# Patient Record
Sex: Male | Born: 1990 | Race: Black or African American | Hispanic: No | Marital: Single | Smoking: Current every day smoker
Health system: Southern US, Community
[De-identification: ages and names within clinical notes are randomized; demographics above are authoritative.]

---
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
## 2011-06-17 ENCOUNTER — Encounter: Payer: Self-pay | Admitting: *Deleted

## 2011-06-17 ENCOUNTER — Emergency Department (HOSPITAL_COMMUNITY)
Admission: EM | Admit: 2011-06-17 | Discharge: 2011-06-17 | Disposition: A | Discharge status: Home / Self Care | Payer: Self-pay | Attending: Emergency Medicine | Admitting: Emergency Medicine

## 2011-06-17 DIAGNOSIS — M79609 Pain in unspecified limb: Secondary | ICD-10-CM | POA: Insufficient documentation

## 2011-06-17 DIAGNOSIS — L03319 Cellulitis of trunk, unspecified: Secondary | ICD-10-CM | POA: Insufficient documentation

## 2011-06-17 DIAGNOSIS — L02214 Cutaneous abscess of groin: Secondary | ICD-10-CM

## 2011-06-17 DIAGNOSIS — L02219 Cutaneous abscess of trunk, unspecified: Secondary | ICD-10-CM | POA: Insufficient documentation

## 2011-06-17 MED ORDER — DOXYCYCLINE HYCLATE 100 MG PO CAPS: 100.0000 mg | ORAL_CAPSULE | Freq: Two times a day (BID) | ORAL | Status: AC

## 2011-06-17 MED ORDER — ONDANSETRON 4 MG PO TBDP
8.0000 mg | ORAL_TABLET | Freq: Once | ORAL | Status: AC
  Administered 2011-06-17: 8 mg via ORAL
  Filled 2011-06-17: qty 2

## 2011-06-17 MED ORDER — NAPROXEN 500 MG PO TABS: 500.0000 mg | ORAL_TABLET | Freq: Two times a day (BID) | ORAL | Status: DC

## 2011-06-17 NOTE — ED Notes (Signed)
C/o R upper medial thigh pain with swelling, pain & swelling near groin. (denies: fever,nvd,urinary sx, back pain, radiation, numbness tingling). No h/o same.

## 2011-06-17 NOTE — ED Provider Notes (Signed)
History     CSN: [id number]  Arrival date & time 06/17/11  0139   First MD Initiated Contact with Patient 06/17/11 0215      Chief Complaint  Patient presents with  . Leg Pain    (Consider location/radiation/quality/duration/timing/severity/associated sxs/prior treatment) HPI Comments: 21 year old male with no medical history who presents with gradual onset of pain pain in his right proximal thigh extending up into his right inguinal area. This is constant, gradually worsening over the last 4 days.  There is no associated testicular pain, urethral discharge, dysuria, abdominal pain, diarrhea.  He denies history of STD or injury to the right leg or groin. Symptoms are persistent  Patient is a 21 y.o. male presenting with leg pain. The history is provided by the patient and a friend.  Leg Pain     History reviewed. No pertinent past medical history.  History reviewed. No pertinent past surgical history.  Family History  Problem Relation Age of Onset  . Hypertension Mother   . Hypertension Other   . Cancer Other     History  Substance Use Topics  . Smoking status: Current Everyday Smoker -- 0.5 packs/day  . Smokeless tobacco: Not on file  . Alcohol Use: Yes      Review of Systems  All other systems reviewed and are negative.    Allergies  Review of patient's allergies indicates no known allergies.  Home Medications   Current Outpatient Rx  Name Route Sig Dispense Refill  . DOXYCYCLINE HYCLATE 100 MG PO CAPS Oral Take 1 capsule (100 mg total) by mouth 2 (two) times daily. 20 capsule 0  . NAPROXEN 500 MG PO TABS Oral Take 1 tablet (500 mg total) by mouth 2 (two) times daily with a meal. 30 tablet 0    BP 164/67  Pulse 119  Temp(Src) 98.5 F (36.9 C) (Oral)  Resp 20  SpO2 100%  Physical Exam  Nursing note and vitals reviewed. Constitutional: He appears well-developed and well-nourished. No distress.  HENT:  Head: Normocephalic and atraumatic.    Mouth/Throat: Oropharynx is clear and moist. No oropharyngeal exudate.  Eyes: Conjunctivae and EOM are normal. Pupils are equal, round, and reactive to light. Right eye exhibits no discharge. Left eye exhibits no discharge. No scleral icterus.  Neck: Normal range of motion. Neck supple. No JVD present. No thyromegaly present.  Cardiovascular: Normal rate, regular rhythm, normal heart sounds and intact distal pulses.  Exam reveals no gallop and no friction rub.   No murmur heard. Pulmonary/Chest: Effort normal and breath sounds normal. No respiratory distress. He has no wheezes. He has no rales.  Abdominal: Soft. Bowel sounds are normal. He exhibits no distension and no mass. There is no tenderness.  Genitourinary:       Normal appearing penis and testicles, right inguinal area with 1.5 cm swollen, moderately tender fluctuant mass.  Does not spread onto the perineum or the scrotum.    Musculoskeletal: Normal range of motion. He exhibits no edema and no tenderness.       Mild tenderness to the right proximal medial thigh, no masses or palpable lymph nodes.  Lymphadenopathy:    He has no cervical adenopathy.  Neurological: He is alert. Coordination normal.  Skin: Skin is warm and dry. No rash noted. No erythema.       No erythema to the skin other than the overlying skin over the abscess in R inguinal area.  Psychiatric: He has a normal mood and affect. His behavior is  normal.    ED Course  Procedures (including critical care time)  Labs Reviewed - No data to display No results found.   1. Abscess of right groin       MDM  Bedside ultrasound confirms abscess, incision and drainage indicated, antibiotics due 2 tenderness in the proximal thigh and possible spread of infection. Patient overall appears well, calm, in no acute distress  INCISION AND DRAINAGE Performed by: Eber Hong D Consent: Verbal consent obtained. Risks and benefits: risks, benefits and alternatives were  discussed Type: abscess  Body area: Right inguinal area  Anesthesia: local infiltration  Local anesthetic: lidocaine 1% % with epinephrine  Anesthetic total: 1 ml  Complexity: complex Blunt dissection to break up loculations  Drainage: purulent  Drainage amount: Small   Packing material: 1/4 in iodoform gauze  Patient tolerance: Patient tolerated the procedure well with no immediate complications.  Discharge prescriptions,   Doxycycline, Naprosyn            Vida Roller, MD 06/17/11 [phone]

## 2011-06-30 IMAGING — CR DG WRIST COMPLETE 3+V*R*
3 series · 3 of 3 positions shown · non-contrast
Comparison: None.

CLINICAL DATA: Injury playing football.

RIGHT WRIST - COMPLETE 3+ VIEW

[view not recorded (1 of 3)]
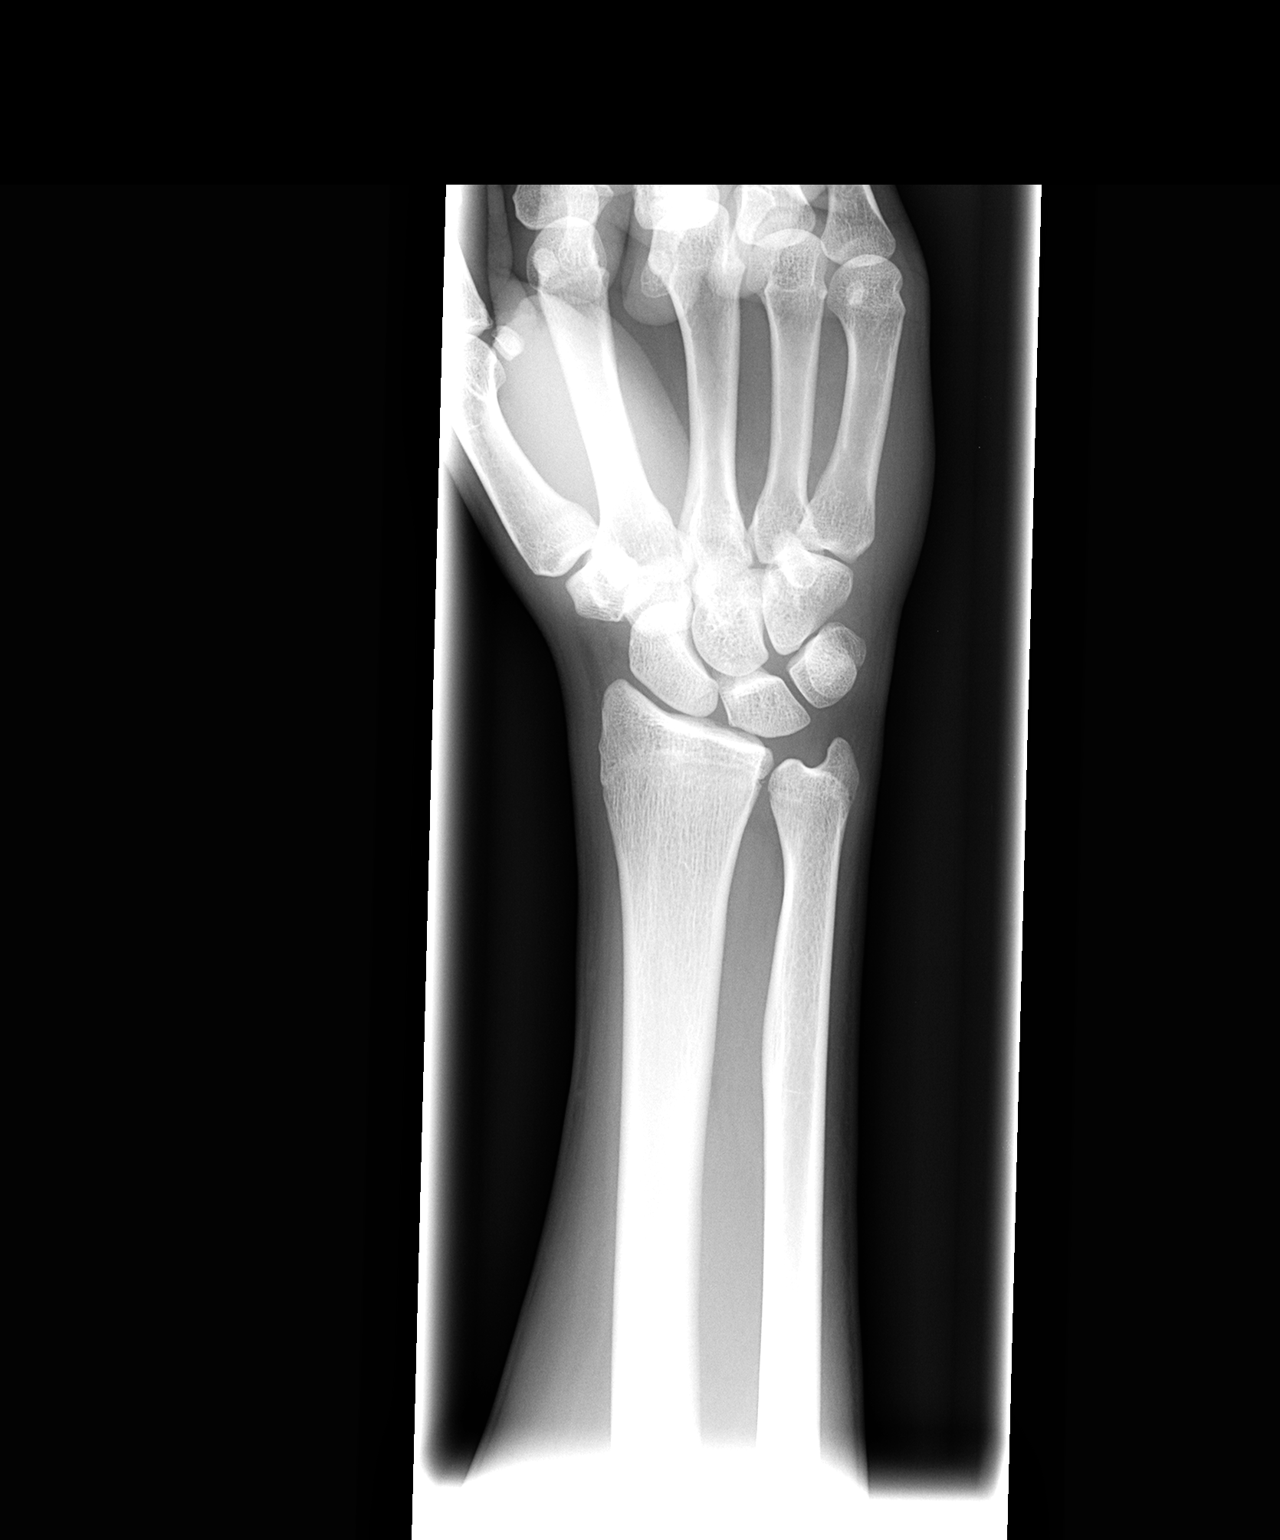

[view not recorded (2 of 3)]
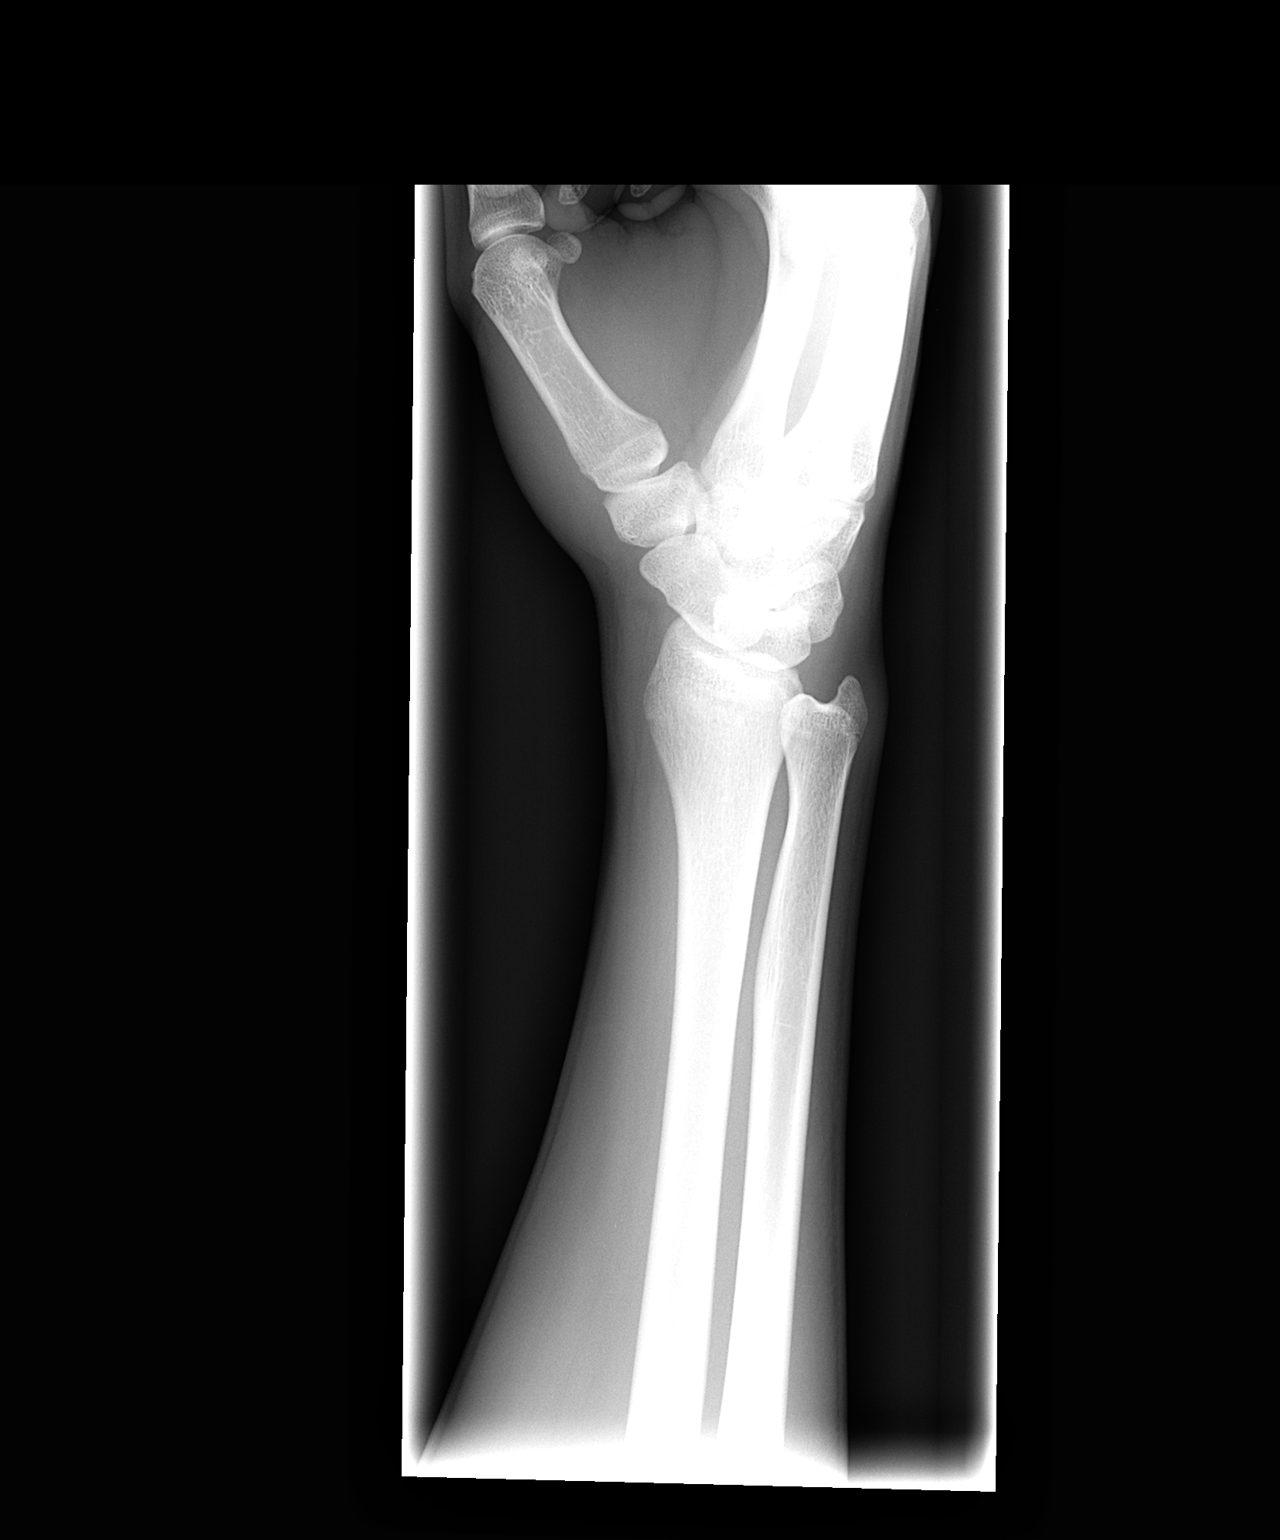

[view not recorded (3 of 3)]
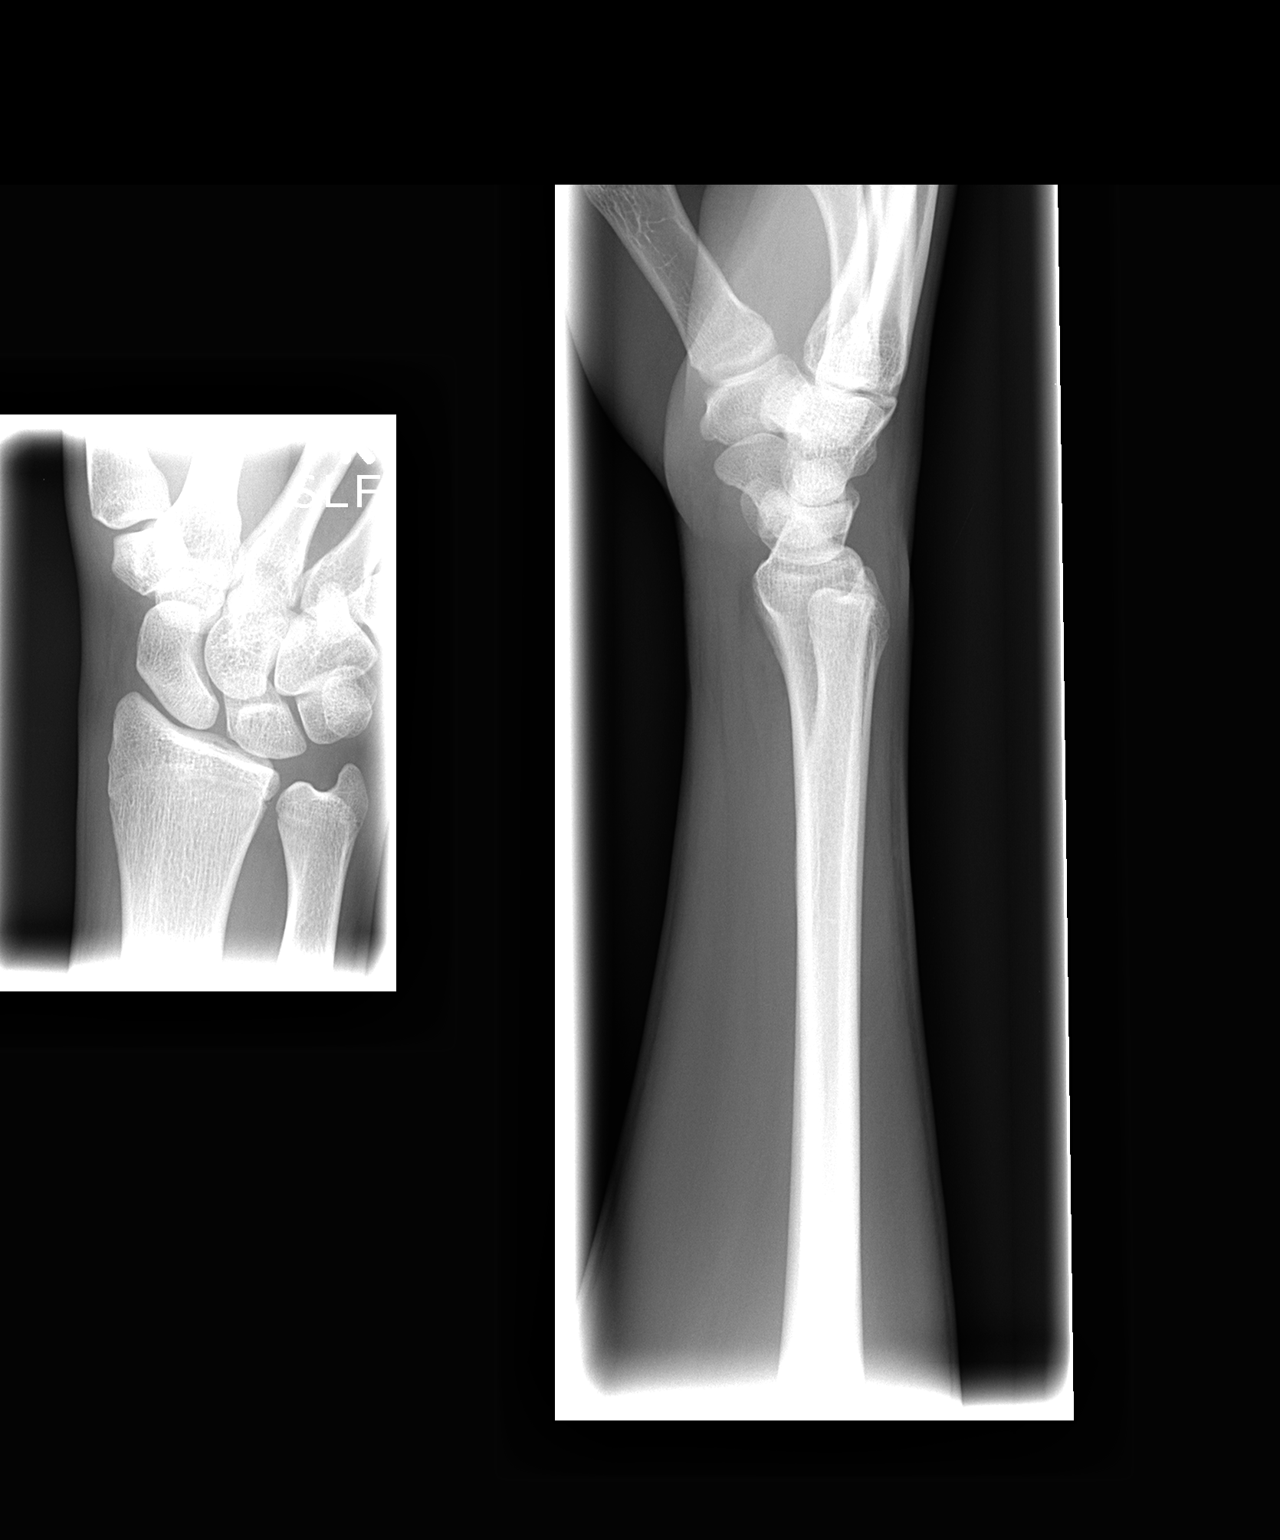

[3 of 3 positions shown; findings below may reference images not displayed]

FINDINGS: No fracture or dislocation.  If there were persistent
anatomical snuffbox tenderness suggesting scaphoid injury and
further delineation were clinically desired then MR scan to exclude
occult scaphoid injury may be considered.
IMPRESSION: No fracture.  Please see above.

## 2011-07-28 ENCOUNTER — Emergency Department (HOSPITAL_COMMUNITY)
Admission: EM | Admit: 2011-07-28 | Discharge: 2011-07-29 | Disposition: A | Discharge status: Home Health | Payer: Self-pay | Attending: Emergency Medicine | Admitting: Emergency Medicine

## 2011-07-28 ENCOUNTER — Encounter (HOSPITAL_COMMUNITY): Payer: Self-pay | Admitting: *Deleted

## 2011-07-28 DIAGNOSIS — L02214 Cutaneous abscess of groin: Secondary | ICD-10-CM

## 2011-07-28 DIAGNOSIS — L02219 Cutaneous abscess of trunk, unspecified: Secondary | ICD-10-CM | POA: Insufficient documentation

## 2011-07-28 DIAGNOSIS — R109 Unspecified abdominal pain: Secondary | ICD-10-CM | POA: Insufficient documentation

## 2011-07-28 DIAGNOSIS — R229 Localized swelling, mass and lump, unspecified: Secondary | ICD-10-CM | POA: Insufficient documentation

## 2011-07-28 NOTE — ED Notes (Signed)
Pt reports having an abscess in his groin that was treated here 1 month ago.  Denies taking antibiotic.  Reports that the abscess and pain have returned.

## 2011-07-29 MED ORDER — HYDROCODONE-ACETAMINOPHEN 5-325 MG PO TABS: 1.0000 | ORAL_TABLET | ORAL | Status: AC | PRN

## 2011-07-29 MED ORDER — SULFAMETHOXAZOLE-TRIMETHOPRIM 800-160 MG PO TABS: 1.0000 | ORAL_TABLET | Freq: Two times a day (BID) | ORAL | Status: AC

## 2011-07-29 NOTE — ED Provider Notes (Signed)
Medical screening examination/treatment/procedure(s) were performed by non-physician practitioner and as supervising physician I was immediately available for consultation/collaboration.   Robertt Buda, MD 07/29/11 0044 

## 2011-07-29 NOTE — ED Provider Notes (Signed)
History     CSN: [id number]  Arrival date & time 07/28/11  2313   First MD Initiated Contact with Patient 07/29/11 0010      Chief Complaint  Patient presents with  . Abscess    (Consider location/radiation/quality/duration/timing/severity/associated sxs/prior treatment) HPI Comments: Patient here with right groin pain - states had an abscess 1 month ago - was seen here and the abscess was drained - he states that it improved but now has noticed pain and swelling - less than before - states did not get antibiotic filled - no fever or chills.  Patient is a 21 y.o. male presenting with abscess. The history is provided by the patient. No language interpreter was used.  Abscess  This is a recurrent problem. The current episode started more than one week ago. The onset was gradual. The problem occurs rarely. The problem has been gradually improving. The abscess is present on the groin. The problem is mild. The abscess is characterized by painfulness. The patient was exposed to OTC medications. The abscess first occurred at home. Pertinent negatives include no anorexia, no decrease in physical activity, not sleeping less, not drinking less, no fever, no fussiness, not sleeping more, no diarrhea, no vomiting, no congestion, no rhinorrhea, no sore throat, no decreased responsiveness and no cough. There were no sick contacts. Recently, medical care has been given at this facility.    History reviewed. No pertinent past medical history.  History reviewed. No pertinent past surgical history.  Family History  Problem Relation Age of Onset  . Hypertension Mother   . Hypertension Other   . Cancer Other     History  Substance Use Topics  . Smoking status: Current Everyday Smoker -- 0.5 packs/day  . Smokeless tobacco: Not on file  . Alcohol Use: Yes      Review of Systems  Constitutional: Negative for fever and decreased responsiveness.  HENT: Negative for congestion, sore throat and  rhinorrhea.   Respiratory: Negative for cough.   Gastrointestinal: Negative for vomiting, diarrhea and anorexia.  Skin: Positive for wound.  All other systems reviewed and are negative.    Allergies  Review of patient's allergies indicates no known allergies.  Home Medications   Current Outpatient Rx  Name Route Sig Dispense Refill  . ACETAMINOPHEN 325 MG PO TABS Oral Take 650 mg by mouth every 6 (six) hours as needed. For pain    . NAPROXEN 500 MG PO TABS Oral Take 1 tablet (500 mg total) by mouth 2 (two) times daily with a meal. 30 tablet 0    BP 153/84  Pulse 88  Temp(Src) 98.1 F (36.7 C) (Oral)  Resp 20  SpO2 100%  Physical Exam  Nursing note and vitals reviewed. Constitutional: He is oriented to person, place, and time. He appears well-developed and well-nourished. No distress.  HENT:  Head: Normocephalic and atraumatic.  Right Ear: External ear normal.  Left Ear: External ear normal.  Nose: Nose normal.  Mouth/Throat: Oropharynx is clear and moist. No oropharyngeal exudate.  Eyes: Conjunctivae are normal. Pupils are equal, round, and reactive to light. No scleral icterus.  Neck: Normal range of motion. Neck supple.  Cardiovascular: Normal rate, regular rhythm and normal heart sounds.  Exam reveals no gallop and no friction rub.   No murmur heard. Pulmonary/Chest: Effort normal and breath sounds normal. No respiratory distress. He exhibits no tenderness.  Abdominal: Soft. Bowel sounds are normal. He exhibits no distension. There is no tenderness.  Genitourinary: Testes normal and  penis normal.    Right testis shows no mass and no tenderness. Left testis shows no mass and no tenderness. Circumcised.  Musculoskeletal: Normal range of motion. He exhibits no edema and no tenderness.  Lymphadenopathy:    He has no cervical adenopathy.       Right: Inguinal adenopathy present.  Neurological: He is alert and oriented to person, place, and time. No cranial nerve  deficit.  Skin: Skin is warm and dry. No rash noted. No erythema. No pallor.  Psychiatric: He has a normal mood and affect. His behavior is normal. Judgment and thought content normal.    ED Course  Procedures (including critical care time)  Labs Reviewed - No data to display No results found.   Right groin lymphadenopathy Right groin inflammation    MDM  Induration without fluctuance - do not feel like area needs I&D at this time - will place on abx and pain control - he will follow up if needed.        Izola Price [place], [place] 07/29/11 0030

## 2011-07-29 NOTE — Discharge Instructions (Signed)
Abscess An abscess (boil or furuncle) is an infected area that contains a collection of pus.  SYMPTOMS Signs and symptoms of an abscess include pain, tenderness, redness, or hardness. You may feel a moveable soft area under your skin. An abscess can occur anywhere in the body.  TREATMENT  A surgical cut (incision) may be made over your abscess to drain the pus. Gauze may be packed into the space or a drain may be looped through the abscess cavity (pocket). This provides a drain that will allow the cavity to heal from the inside outwards. The abscess may be painful for a few days, but should feel much better if it was drained.  Your abscess, if seen early, may not have localized and may not have been drained. If not, another appointment may be required if it does not get better on its own or with medications. HOME CARE INSTRUCTIONS   Only take over-the-counter or prescription medicines for pain, discomfort, or fever as directed by your caregiver.   Take your antibiotics as directed if they were prescribed. Finish them even if you start to feel better.   Keep the skin and clothes clean around your abscess.   If the abscess was drained, you will need to use gauze dressing to collect any draining pus. Dressings will typically need to be changed 3 or more times a day.   The infection may spread by skin contact with others. Avoid skin contact as much as possible.   Practice good hygiene. This includes regular hand washing, cover any draining skin lesions, and do not share personal care items.   If you participate in sports, do not share athletic equipment, towels, whirlpools, or personal care items. Shower after every practice or tournament.   If a draining area cannot be adequately covered:   Do not participate in sports.   Children should not participate in day care until the wound has healed or drainage stops.   If your caregiver has given you a follow-up appointment, it is very important  to keep that appointment. Not keeping the appointment could result in a much worse infection, chronic or permanent injury, pain, and disability. If there is any problem keeping the appointment, you must call back to this facility for assistance.  SEEK MEDICAL CARE IF:   You develop increased pain, swelling, redness, drainage, or bleeding in the wound site.   You develop signs of generalized infection including muscle aches, chills, fever, or a general ill feeling.   You have an oral temperature above 102 F (38.9 C).  MAKE SURE YOU:   Understand these instructions.   Will watch your condition.   Will get help right away if you are not doing well or get worse.  Document Released: 03/05/2005 Document Revised: 02/05/2011 Document Reviewed: 12/28/2007 [facility] Patient Information 2012 [place], [place].Skin Infections A skin infection usually develops as a result of disruption of the skin barrier.  CAUSES  A skin infection might occur following:  Trauma or an injury to the skin such as a cut or insect sting.   Inflammation (as in eczema).   Breaks in the skin between the toes (as in athlete's foot).   Swelling (edema).  SYMPTOMS  The legs are the most common site affected. Usually there is:  Redness.   Swelling.   Pain.   There may be red streaks in the area of the infection.  TREATMENT   Minor skin infections may be treated with topical antibiotics, but if the skin infection  is severe, hospital care and intravenous (IV) antibiotic treatment may be needed.   Most often skin infections can be treated with oral antibiotic medicine as well as proper rest and elevation of the affected area until the infection improves.   If you are prescribed oral antibiotics, it is important to take them as directed and to take all the pills even if you feel better before you have finished all of the medicine.   You may apply warm compresses to the area for 20-30 minutes 4 times daily.  You  might need a tetanus shot now if:  You have no idea when you had the last one.   You have never had a tetanus shot before.   Your wound had dirt in it.  If you need a tetanus shot and you decide not to get one, there is a rare chance of getting tetanus. Sickness from tetanus can be serious. If you get a tetanus shot, your arm may swell and become red and warm at the shot site. This is common and not a problem. SEEK MEDICAL CARE IF:  The pain and swelling from your infection do not improve within 2 days.  SEEK IMMEDIATE MEDICAL CARE IF:  You develop a fever, chills, or other serious problems.  Document Released: 07/03/2004 Document Revised: 02/05/2011 Document Reviewed: 05/15/2008 [facility] Patient Information 2012 [place], [place].

## 2012-01-20 ENCOUNTER — Emergency Department (HOSPITAL_COMMUNITY)
Admission: EM | Admit: 2012-01-20 | Discharge: 2012-01-20 | Disposition: A | Discharge status: Home / Self Care | Payer: Self-pay | Attending: Emergency Medicine | Admitting: Emergency Medicine

## 2012-01-20 ENCOUNTER — Encounter (HOSPITAL_COMMUNITY): Payer: Self-pay | Admitting: Emergency Medicine

## 2012-01-20 DIAGNOSIS — F17200 Nicotine dependence, unspecified, uncomplicated: Secondary | ICD-10-CM | POA: Insufficient documentation

## 2012-01-20 DIAGNOSIS — L03317 Cellulitis of buttock: Secondary | ICD-10-CM | POA: Insufficient documentation

## 2012-01-20 DIAGNOSIS — L0231 Cutaneous abscess of buttock: Secondary | ICD-10-CM | POA: Insufficient documentation

## 2012-01-20 MED ORDER — MORPHINE SULFATE 4 MG/ML IJ SOLN
6.0000 mg | Freq: Once | INTRAMUSCULAR | Status: AC
  Administered 2012-01-20: 6 mg via INTRAVENOUS
  Filled 2012-01-20: qty 2

## 2012-01-20 MED ORDER — ONDANSETRON HCL 4 MG/2ML IJ SOLN
4.0000 mg | Freq: Once | INTRAMUSCULAR | Status: AC
  Administered 2012-01-20: 4 mg via INTRAVENOUS
  Filled 2012-01-20: qty 2

## 2012-01-20 MED ORDER — PERCOCET 5-325 MG PO TABS: 1.0000 | ORAL_TABLET | Freq: Four times a day (QID) | ORAL | Status: AC | PRN

## 2012-01-20 NOTE — ED Notes (Signed)
Pt has abcess to left buttock at rectum for approx 2-3 days.  Has sat in warm water without relief.  No drainage present

## 2012-01-20 NOTE — ED Provider Notes (Signed)
History     CSN: [id number]  Arrival date & time 01/20/12  1807   First MD Initiated Contact with Patient 01/20/12 2008      Chief Complaint  Patient presents with  . Abscess    (Consider location/radiation/quality/duration/timing/severity/associated sxs/prior treatment) HPI Comments: Patient with a history of abscesses presents emergency department with chief complaint of abscess.  Location is left buttocks.  Onset of symptoms began 3 days ago and is associated with severe pain.  Pain severity 10/10.  Pain did not radiate.  Exacerbated by any palpation and certain movements.  Patient denies fever, night sweats, chills, melena, drainage from abscess, hematochezia.  Patient is a 21 y.o. male presenting with abscess. The history is provided by the patient.  Abscess  Pertinent negatives include no fever.    History reviewed. No pertinent past medical history.  History reviewed. No pertinent past surgical history.  Family History  Problem Relation Age of Onset  . Hypertension Mother   . Hypertension Other   . Cancer Other     History  Substance Use Topics  . Smoking status: Current Everyday Smoker -- 0.5 packs/day  . Smokeless tobacco: Not on file  . Alcohol Use: Yes      Review of Systems  Constitutional: Negative for fever, chills, diaphoresis and activity change.       Denies night sweats  HENT: Negative for neck stiffness.   Eyes: Negative for visual disturbance.  Respiratory: Negative for shortness of breath.   Cardiovascular: Negative for chest pain.  Gastrointestinal: Negative for abdominal pain.  Genitourinary: Negative for dysuria, urgency and frequency.  Musculoskeletal: Negative for gait problem.  Skin: Negative for color change and rash.  Neurological: Negative for dizziness, light-headedness and headaches.  Hematological: Negative for adenopathy.  All other systems reviewed and are negative.    Allergies  Review of patient's allergies indicates no  known allergies.  Home Medications  No current outpatient prescriptions on file.  BP 145/92  Pulse 105  Temp 99.4 F (37.4 C) (Oral)  Resp 18  SpO2 98%  Physical Exam  Nursing note and vitals reviewed. Constitutional: He is oriented to person, place, and time. He appears well-developed and well-nourished. He does not have a sickly appearance. He does not appear ill. No distress.  HENT:  Head: Normocephalic and atraumatic.  Eyes: Conjunctivae and EOM are normal.  Neck: Normal range of motion. Neck supple.  Cardiovascular: Normal rate and regular rhythm.   Pulmonary/Chest: Effort normal and breath sounds normal.  Genitourinary:       Chaperone was present. Patient with no pain around the rectal area. There are no external fissures noted. No induration of the skin or swelling. No external hemorrhoids seen. Patient able to tolerate examination. I was able to feel the first 2-3cm of the rectum digitally without gross abnormality. There is no gross blood. Abscess located on left buttock, NO signs of perirectal abscess.    Musculoskeletal: He exhibits no edema.  Lymphadenopathy:       Head (right side): No submental, no preauricular and no posterior auricular adenopathy present.       Head (left side): No submental, no submandibular, no preauricular and no posterior auricular adenopathy present.    He has no axillary adenopathy.  Neurological: He is alert and oriented to person, place, and time.  Skin: Skin is warm and dry. No rash noted. He is not diaphoretic.       3 x 3 cm sized abscess located on left gluteal region.  Extreme tenderness to palpation. Not currently draining. Abscess is fluctuant without warmth, surrounding erythema, or induration.    ED Course  Procedures (including critical care time)  Labs Reviewed - No data to display No results found.   No diagnosis found.    MDM  Buttock abscess  Patient with skin abscess amenable to incision and drainage.  Abscess  was large enough to warrant packing with removal and wound recheck in 2 days. No signs of cellulitis is surrounding skin.  Will d/c to home.  No antibiotic therapy is indicated.         Jaci Carrel, [place] 01/20/12 2154

## 2012-01-20 NOTE — ED Notes (Signed)
Pt c/o abscess to left buttocks x 3 days 

## 2012-01-23 NOTE — ED Provider Notes (Signed)
Medical screening examination/treatment/procedure(s) were performed by non-physician practitioner and as supervising physician I was immediately available for consultation/collaboration.  Flint Melter, MD 01/23/12 [phone]

## 2018-12-06 DIAGNOSIS — L608 Other nail disorders: Secondary | ICD-10-CM | POA: Diagnosis not present

## 2018-12-06 DIAGNOSIS — R456 Violent behavior: Secondary | ICD-10-CM | POA: Diagnosis not present

## 2018-12-06 DIAGNOSIS — F329 Major depressive disorder, single episode, unspecified: Secondary | ICD-10-CM | POA: Diagnosis not present

## 2018-12-06 DIAGNOSIS — S37009A Unspecified injury of unspecified kidney, initial encounter: Secondary | ICD-10-CM | POA: Diagnosis not present

## 2018-12-06 DIAGNOSIS — F1011 Alcohol abuse, in remission: Secondary | ICD-10-CM | POA: Diagnosis not present

## 2018-12-06 DIAGNOSIS — L84 Corns and callosities: Secondary | ICD-10-CM | POA: Diagnosis not present

## 2018-12-06 DIAGNOSIS — R748 Abnormal levels of other serum enzymes: Secondary | ICD-10-CM | POA: Diagnosis not present

## 2018-12-06 DIAGNOSIS — R454 Irritability and anger: Secondary | ICD-10-CM | POA: Diagnosis not present

## 2018-12-06 DIAGNOSIS — Z789 Other specified health status: Secondary | ICD-10-CM | POA: Diagnosis not present

## 2018-12-21 DIAGNOSIS — F3181 Bipolar II disorder: Secondary | ICD-10-CM | POA: Diagnosis not present

## 2018-12-21 DIAGNOSIS — F4310 Post-traumatic stress disorder, unspecified: Secondary | ICD-10-CM | POA: Diagnosis not present

## 2018-12-29 DIAGNOSIS — F3181 Bipolar II disorder: Secondary | ICD-10-CM | POA: Diagnosis not present

## 2018-12-29 DIAGNOSIS — F4310 Post-traumatic stress disorder, unspecified: Secondary | ICD-10-CM | POA: Diagnosis not present

## 2019-01-03 DIAGNOSIS — F3181 Bipolar II disorder: Secondary | ICD-10-CM | POA: Diagnosis not present

## 2019-01-03 DIAGNOSIS — F4310 Post-traumatic stress disorder, unspecified: Secondary | ICD-10-CM | POA: Diagnosis not present

## 2019-02-21 DIAGNOSIS — F4310 Post-traumatic stress disorder, unspecified: Secondary | ICD-10-CM | POA: Diagnosis not present

## 2019-02-21 DIAGNOSIS — F3181 Bipolar II disorder: Secondary | ICD-10-CM | POA: Diagnosis not present

## 2019-07-27 DIAGNOSIS — B353 Tinea pedis: Secondary | ICD-10-CM | POA: Diagnosis not present

## 2019-07-27 DIAGNOSIS — B351 Tinea unguium: Secondary | ICD-10-CM | POA: Diagnosis not present

## 2019-07-27 DIAGNOSIS — L84 Corns and callosities: Secondary | ICD-10-CM | POA: Diagnosis not present

## 2019-11-14 ENCOUNTER — Ambulatory Visit
Admission: EM | Admit: 2019-11-14 | Discharge: 2019-11-14 | Disposition: A | Discharge status: Home / Self Care | Payer: Medicaid Other | Attending: Physician Assistant | Admitting: Physician Assistant

## 2019-11-14 ENCOUNTER — Encounter: Payer: Self-pay | Admitting: Emergency Medicine

## 2019-11-14 ENCOUNTER — Other Ambulatory Visit: Payer: Self-pay

## 2019-11-14 DIAGNOSIS — J020 Streptococcal pharyngitis: Secondary | ICD-10-CM | POA: Diagnosis not present

## 2019-11-14 LAB — POCT RAPID STREP A (OFFICE): Rapid Strep A Screen: POSITIVE — AB

## 2019-11-14 MED ORDER — AMOXICILLIN 500 MG PO CAPS: 500.0000 mg | ORAL_CAPSULE | Freq: Two times a day (BID) | ORAL | 0 refills | Status: DC

## 2019-11-14 NOTE — ED Triage Notes (Signed)
Seen by provider prior to this nurse.  Extreme sore throat that started yesterday, but escalated today.

## 2019-11-14 NOTE — Discharge Instructions (Signed)
Rapid strep positive. Start amoxicillin as directed. Tylenol/Motrin for fever and pain. Monitor for any worsening of symptoms, trouble breathing, trouble swallowing, swelling of the throat, leaning forward to breath, drooling, follow up here or at the emergency department for reevaluation.  Tylenol 1000mg  three times a day, ibuprofen 800mg  three times a day.

## 2019-11-14 NOTE — ED Provider Notes (Signed)
[facility]    CSN: [id number] Arrival date & time: 11/14/19  0093      History   Chief Complaint Chief Complaint  Patient presents with  . Sore Throat    HPI Tyler Mendoza is a 29 y.o. male.   29 year old male comes in for 2 day of sore throat, left sided neck pain. Denies injury/trauma. States feels that the neck is swollen, and painful palpation. Painful swallowing without tripoding, drooling, trismus. States due to sore throat/neck pain, have trouble breathing smoothly. Denies shortness of breath with exertion. Denies rhinorrhea, nasal congestion, cough. Denies fever, chills, body aches. Denies abdominal pain, nausea, vomiting, diarrhea. Denies loss of taste/smell. Tylenol last night.      History reviewed. No pertinent past medical history.  There are no problems to display for this patient.   History reviewed. No pertinent surgical history.     Home Medications    Prior to Admission medications   Medication Sig Start Date End Date Taking? Authorizing Provider  amoxicillin (AMOXIL) 500 MG capsule Take 1 capsule (500 mg total) by mouth 2 (two) times daily. 11/14/19   Belinda Fisher, PA-C    Family History Family History  Problem Relation Age of Onset  . Hypertension Mother   . Hypertension Other   . Cancer Other     Social History Social History   Tobacco Use  . Smoking status: Current Every Day Smoker    Packs/day: 0.50  Substance Use Topics  . Alcohol use: Yes  . Drug use: Yes    Types: Marijuana     Allergies   Patient has no known allergies.   Review of Systems Review of Systems  Reason unable to perform ROS: See HPI as above.     Physical Exam Triage Vital Signs ED Triage Vitals [11/14/19 0820]  Enc Vitals Group     BP 136/88     Pulse Rate 92     Resp 16     Temp 100.1 F (37.8 C)     Temp Source Oral     SpO2 97 %     Weight      Height      Head Circumference      Peak Flow      Pain Score      Pain Loc      Pain  Edu?      Excl. in GC?    No data found.  Updated Vital Signs BP 136/88 (BP Location: Left Arm)   Pulse 92   Temp 100.1 F (37.8 C) (Oral)   Resp 16   SpO2 97%   Physical Exam Constitutional:      General: He is not in acute distress.    Appearance: He is well-developed. He is not ill-appearing, toxic-appearing or diaphoretic.  HENT:     Head: Normocephalic and atraumatic.     Right Ear: Tympanic membrane, ear canal and external ear normal. Tympanic membrane is not erythematous or bulging.     Left Ear: Tympanic membrane, ear canal and external ear normal. Tympanic membrane is not erythematous or bulging.     Nose:     Right Sinus: No maxillary sinus tenderness or frontal sinus tenderness.     Left Sinus: No maxillary sinus tenderness or frontal sinus tenderness.     Mouth/Throat:     Mouth: Mucous membranes are moist.     Pharynx: Oropharynx is clear. Uvula midline.     Tonsils: Tonsillar exudate present.  1+ on the right. 1+ on the left.  Eyes:     Conjunctiva/sclera: Conjunctivae normal.     Pupils: Pupils are equal, round, and reactive to light.  Cardiovascular:     Rate and Rhythm: Normal rate and regular rhythm.  Pulmonary:     Effort: Pulmonary effort is normal. No accessory muscle usage, prolonged expiration, respiratory distress or retractions.     Breath sounds: No decreased air movement or transmitted upper airway sounds. No decreased breath sounds.     Comments: LCTAB Musculoskeletal:     Cervical back: Normal range of motion and neck supple.  Lymphadenopathy:     Cervical: Cervical adenopathy present.  Skin:    General: Skin is warm and dry.  Neurological:     Mental Status: He is alert and oriented to person, place, and time.      UC Treatments / Results  Labs (all labs ordered are listed, but only abnormal results are displayed) Labs Reviewed  POCT RAPID STREP A (OFFICE) - Abnormal; Notable for the following components:      Result Value   Rapid  Strep A Screen Positive (*)    All other components within normal limits    EKG   Radiology No results found.  Procedures Procedures (including critical care time)  Medications Ordered in UC Medications - No data to display  Initial Impression / Assessment and Plan / UC Course  I have reviewed the triage vital signs and the nursing notes.  Pertinent labs & imaging results that were available during my care of the patient were reviewed by me and considered in my medical decision making (see chart for details).    Rapid strep positive. Patient handling own secretions well without tripoding, drooling, trismus. Start antibiotic as directed. Symptomatic treatment as needed. Return precautions given.   Final Clinical Impressions(s) / UC Diagnoses   Final diagnoses:  Strep pharyngitis   ED Prescriptions    Medication Sig Dispense Auth. Provider   amoxicillin (AMOXIL) 500 MG capsule Take 1 capsule (500 mg total) by mouth 2 (two) times daily. 20 capsule Ok Edwards, PA-C     PDMP not reviewed this encounter.   Ok Edwards, PA-C 11/14/19 [phone]

## 2019-12-08 DIAGNOSIS — Z419 Encounter for procedure for purposes other than remedying health state, unspecified: Secondary | ICD-10-CM | POA: Diagnosis not present

## 2020-01-08 DIAGNOSIS — Z419 Encounter for procedure for purposes other than remedying health state, unspecified: Secondary | ICD-10-CM | POA: Diagnosis not present

## 2020-01-16 ENCOUNTER — Ambulatory Visit
Admission: EM | Admit: 2020-01-16 | Discharge: 2020-01-16 | Disposition: A | Discharge status: Home / Self Care | Payer: Medicaid Other | Attending: Emergency Medicine | Admitting: Emergency Medicine

## 2020-01-16 ENCOUNTER — Other Ambulatory Visit: Payer: Self-pay

## 2020-01-16 DIAGNOSIS — K0889 Other specified disorders of teeth and supporting structures: Secondary | ICD-10-CM | POA: Diagnosis not present

## 2020-01-16 MED ORDER — IBUPROFEN 800 MG PO TABS: 800.0000 mg | ORAL_TABLET | Freq: Three times a day (TID) | ORAL | 0 refills | Status: AC

## 2020-01-16 NOTE — Discharge Instructions (Addendum)
Low-Cost Community Dental Resources:  [place] Address: [address], [place], [place], [address] Phone: [phone]  - Dr. Civils Address: [address], [place], [place], [address] Phone: [phone] 

## 2020-01-16 NOTE — ED Triage Notes (Signed)
Pt c/o lt lower tooth broke 3-4 months ago. States pain is unbearable today with some swelling noted.

## 2020-01-16 NOTE — ED Provider Notes (Signed)
[facility]    CSN: [id number] Arrival date & time: 01/16/20  1527      History   Chief Complaint Chief Complaint  Patient presents with  . Dental Pain    HPI Tyler Mendoza is a 29 y.o. male presenting for dental pain.  States he cracked his left lower tooth 3-4 months ago.  No discharge, malodor, fever, ear pain, difficulty breathing or swallowing.  Has been trying Tylenol, ibuprofen without relief.  Does not have dentist.    History reviewed. No pertinent past medical history.  There are no problems to display for this patient.   History reviewed. No pertinent surgical history.     Home Medications    Prior to Admission medications   Medication Sig Start Date End Date Taking? Authorizing Provider  ibuprofen (ADVIL) 800 MG tablet Take 1 tablet (800 mg total) by mouth 3 (three) times daily. 01/16/20   Hall-Potvin, [place], PA-C    Family History Family History  Problem Relation Age of Onset  . Hypertension Mother   . Hypertension Other   . Cancer Other     Social History Social History   Tobacco Use  . Smoking status: Current Every Day Smoker    Packs/day: 0.50  . Smokeless tobacco: Never Used  Substance Use Topics  . Alcohol use: Yes  . Drug use: Yes    Types: Marijuana     Allergies   Patient has no known allergies.   Review of Systems As per HPI   Physical Exam Triage Vital Signs ED Triage Vitals  Enc Vitals Group     BP      Pulse      Resp      Temp      Temp src      SpO2      Weight      Height      Head Circumference      Peak Flow      Pain Score      Pain Loc      Pain Edu?      Excl. in GC?    No data found.  Updated Vital Signs BP 134/81 (BP Location: Left Arm)   Pulse 67   Temp 98.7 F (37.1 C) (Oral)   Resp 18   SpO2 97%   Visual Acuity Right Eye Distance:   Left Eye Distance:   Bilateral Distance:    Right Eye Near:   Left Eye Near:    Bilateral Near:     Physical Exam Constitutional:        General: He is not in acute distress. HENT:     Head: Normocephalic and atraumatic.     Mouth/Throat:     Mouth: Mucous membranes are moist.     Pharynx: Oropharynx is clear. No oropharyngeal exudate or posterior oropharyngeal erythema.     Comments: Fair dentition.  Left lower rear molar with cracked, decayed to gumline.  No gingival edema, erythema, fluctuance.  Mild TTP. Eyes:     General: No scleral icterus.    Pupils: Pupils are equal, round, and reactive to light.  Cardiovascular:     Rate and Rhythm: Normal rate.  Pulmonary:     Effort: Pulmonary effort is normal. No respiratory distress.     Breath sounds: No wheezing.  Musculoskeletal:     Cervical back: Neck supple. No tenderness.  Lymphadenopathy:     Cervical: No cervical adenopathy.  Skin:    Coloration: Skin is  not jaundiced or pale.  Neurological:     Mental Status: He is alert and oriented to person, place, and time.      UC Treatments / Results  Labs (all labs ordered are listed, but only abnormal results are displayed) Labs Reviewed - No data to display  EKG   Radiology No results found.  Procedures Procedures (including critical care time)  Medications Ordered in UC Medications - No data to display  Initial Impression / Assessment and Plan / UC Course  I have reviewed the triage vital signs and the nursing notes.  Pertinent labs & imaging results that were available during my care of the patient were reviewed by me and considered in my medical decision making (see chart for details).     Patient febrile, nontoxic in office today.  Dental pain likely second to cracked tooth.  Provided low cost community resources outlined below.  Will increase ibuprofen, to supplement with Tylenol.  No concern for infectious process at this time.  Return precautions discussed, pt verbalized understanding and is agreeable to plan. Final Clinical Impressions(s) / UC Diagnoses   Final diagnoses:  Pain, dental      Discharge Instructions     Low-Cost Community Dental Resources:  [facility] Address: [address], [place], [place], [address] Phone: [phone]  - Dr. Lawrence Marseilles Address: [address] [place], [place], [address] Phone: [phone]    ED Prescriptions    Medication Sig Dispense Auth. Provider   ibuprofen (ADVIL) 800 MG tablet Take 1 tablet (800 mg total) by mouth 3 (three) times daily. 21 tablet Hall-Potvin, [place], PA-C     I have reviewed the PDMP during this encounter.   Hall-Potvin, [place], [place] 01/16/20 1714

## 2020-02-08 DIAGNOSIS — Z419 Encounter for procedure for purposes other than remedying health state, unspecified: Secondary | ICD-10-CM | POA: Diagnosis not present

## 2020-03-09 DIAGNOSIS — Z419 Encounter for procedure for purposes other than remedying health state, unspecified: Secondary | ICD-10-CM | POA: Diagnosis not present

## 2020-04-09 DIAGNOSIS — Z419 Encounter for procedure for purposes other than remedying health state, unspecified: Secondary | ICD-10-CM | POA: Diagnosis not present

## 2020-05-09 DIAGNOSIS — Z419 Encounter for procedure for purposes other than remedying health state, unspecified: Secondary | ICD-10-CM | POA: Diagnosis not present

## 2020-06-09 DIAGNOSIS — Z419 Encounter for procedure for purposes other than remedying health state, unspecified: Secondary | ICD-10-CM | POA: Diagnosis not present

## 2020-06-30 DIAGNOSIS — Z20822 Contact with and (suspected) exposure to covid-19: Secondary | ICD-10-CM | POA: Diagnosis not present

## 2020-07-10 DIAGNOSIS — Z419 Encounter for procedure for purposes other than remedying health state, unspecified: Secondary | ICD-10-CM | POA: Diagnosis not present

## 2020-07-23 DIAGNOSIS — Z20822 Contact with and (suspected) exposure to covid-19: Secondary | ICD-10-CM | POA: Diagnosis not present

## 2020-08-07 DIAGNOSIS — Z419 Encounter for procedure for purposes other than remedying health state, unspecified: Secondary | ICD-10-CM | POA: Diagnosis not present

## 2020-09-07 DIAGNOSIS — Z419 Encounter for procedure for purposes other than remedying health state, unspecified: Secondary | ICD-10-CM | POA: Diagnosis not present

## 2020-10-07 DIAGNOSIS — Z419 Encounter for procedure for purposes other than remedying health state, unspecified: Secondary | ICD-10-CM | POA: Diagnosis not present

## 2020-11-07 DIAGNOSIS — Z419 Encounter for procedure for purposes other than remedying health state, unspecified: Secondary | ICD-10-CM | POA: Diagnosis not present

## 2020-11-14 DIAGNOSIS — L02432 Carbuncle of left axilla: Secondary | ICD-10-CM | POA: Diagnosis not present

## 2020-11-15 DIAGNOSIS — L02432 Carbuncle of left axilla: Secondary | ICD-10-CM | POA: Diagnosis not present

## 2020-12-07 DIAGNOSIS — Z419 Encounter for procedure for purposes other than remedying health state, unspecified: Secondary | ICD-10-CM | POA: Diagnosis not present

## 2021-01-07 DIAGNOSIS — Z419 Encounter for procedure for purposes other than remedying health state, unspecified: Secondary | ICD-10-CM | POA: Diagnosis not present

## 2021-02-07 DIAGNOSIS — Z419 Encounter for procedure for purposes other than remedying health state, unspecified: Secondary | ICD-10-CM | POA: Diagnosis not present

## 2021-03-09 DIAGNOSIS — Z419 Encounter for procedure for purposes other than remedying health state, unspecified: Secondary | ICD-10-CM | POA: Diagnosis not present

## 2021-04-09 DIAGNOSIS — Z419 Encounter for procedure for purposes other than remedying health state, unspecified: Secondary | ICD-10-CM | POA: Diagnosis not present

## 2021-05-09 DIAGNOSIS — Z419 Encounter for procedure for purposes other than remedying health state, unspecified: Secondary | ICD-10-CM | POA: Diagnosis not present

## 2021-06-09 DIAGNOSIS — Z419 Encounter for procedure for purposes other than remedying health state, unspecified: Secondary | ICD-10-CM | POA: Diagnosis not present

## 2021-07-10 DIAGNOSIS — Z419 Encounter for procedure for purposes other than remedying health state, unspecified: Secondary | ICD-10-CM | POA: Diagnosis not present

## 2021-08-07 DIAGNOSIS — Z419 Encounter for procedure for purposes other than remedying health state, unspecified: Secondary | ICD-10-CM | POA: Diagnosis not present

## 2021-09-07 DIAGNOSIS — Z419 Encounter for procedure for purposes other than remedying health state, unspecified: Secondary | ICD-10-CM | POA: Diagnosis not present

## 2021-10-07 DIAGNOSIS — Z419 Encounter for procedure for purposes other than remedying health state, unspecified: Secondary | ICD-10-CM | POA: Diagnosis not present

## 2021-11-07 DIAGNOSIS — Z419 Encounter for procedure for purposes other than remedying health state, unspecified: Secondary | ICD-10-CM | POA: Diagnosis not present

## 2021-12-07 DIAGNOSIS — Z419 Encounter for procedure for purposes other than remedying health state, unspecified: Secondary | ICD-10-CM | POA: Diagnosis not present

## 2022-01-07 DIAGNOSIS — Z419 Encounter for procedure for purposes other than remedying health state, unspecified: Secondary | ICD-10-CM | POA: Diagnosis not present

## 2022-02-07 DIAGNOSIS — Z419 Encounter for procedure for purposes other than remedying health state, unspecified: Secondary | ICD-10-CM | POA: Diagnosis not present

## 2022-03-09 DIAGNOSIS — Z419 Encounter for procedure for purposes other than remedying health state, unspecified: Secondary | ICD-10-CM | POA: Diagnosis not present

## 2022-04-09 DIAGNOSIS — Z419 Encounter for procedure for purposes other than remedying health state, unspecified: Secondary | ICD-10-CM | POA: Diagnosis not present

## 2022-05-09 DIAGNOSIS — Z419 Encounter for procedure for purposes other than remedying health state, unspecified: Secondary | ICD-10-CM | POA: Diagnosis not present

## 2023-09-04 DIAGNOSIS — S62327A Displaced fracture of shaft of fifth metacarpal bone, left hand, initial encounter for closed fracture: Secondary | ICD-10-CM | POA: Diagnosis not present

## 2023-09-04 DIAGNOSIS — Z01812 Encounter for preprocedural laboratory examination: Secondary | ICD-10-CM | POA: Diagnosis not present

## 2023-09-04 DIAGNOSIS — Z01818 Encounter for other preprocedural examination: Secondary | ICD-10-CM | POA: Diagnosis not present

## 2023-09-04 DIAGNOSIS — Z01810 Encounter for preprocedural cardiovascular examination: Secondary | ICD-10-CM | POA: Diagnosis not present

## 2023-09-08 DIAGNOSIS — S62327A Displaced fracture of shaft of fifth metacarpal bone, left hand, initial encounter for closed fracture: Secondary | ICD-10-CM | POA: Diagnosis not present

## 2023-09-19 DIAGNOSIS — Z419 Encounter for procedure for purposes other than remedying health state, unspecified: Secondary | ICD-10-CM | POA: Diagnosis not present

## 2023-09-22 DIAGNOSIS — S62327A Displaced fracture of shaft of fifth metacarpal bone, left hand, initial encounter for closed fracture: Secondary | ICD-10-CM | POA: Diagnosis not present

## 2023-10-06 DIAGNOSIS — S62327D Displaced fracture of shaft of fifth metacarpal bone, left hand, subsequent encounter for fracture with routine healing: Secondary | ICD-10-CM | POA: Diagnosis not present

## 2023-10-08 DIAGNOSIS — E611 Iron deficiency: Secondary | ICD-10-CM | POA: Diagnosis not present

## 2023-10-08 DIAGNOSIS — Z1159 Encounter for screening for other viral diseases: Secondary | ICD-10-CM | POA: Diagnosis not present

## 2023-10-08 DIAGNOSIS — Z1329 Encounter for screening for other suspected endocrine disorder: Secondary | ICD-10-CM | POA: Diagnosis not present

## 2023-10-08 DIAGNOSIS — Z13228 Encounter for screening for other metabolic disorders: Secondary | ICD-10-CM | POA: Diagnosis not present

## 2023-10-08 DIAGNOSIS — S6292XG Unspecified fracture of left wrist and hand, subsequent encounter for fracture with delayed healing: Secondary | ICD-10-CM | POA: Diagnosis not present

## 2023-10-08 DIAGNOSIS — Z13220 Encounter for screening for lipoid disorders: Secondary | ICD-10-CM | POA: Diagnosis not present

## 2023-10-08 DIAGNOSIS — Z114 Encounter for screening for human immunodeficiency virus [HIV]: Secondary | ICD-10-CM | POA: Diagnosis not present

## 2023-10-08 DIAGNOSIS — Z131 Encounter for screening for diabetes mellitus: Secondary | ICD-10-CM | POA: Diagnosis not present

## 2023-10-08 DIAGNOSIS — Z7689 Persons encountering health services in other specified circumstances: Secondary | ICD-10-CM | POA: Diagnosis not present

## 2023-10-08 DIAGNOSIS — R5383 Other fatigue: Secondary | ICD-10-CM | POA: Diagnosis not present

## 2023-10-19 DIAGNOSIS — Z419 Encounter for procedure for purposes other than remedying health state, unspecified: Secondary | ICD-10-CM | POA: Diagnosis not present

## 2023-11-19 DIAGNOSIS — Z419 Encounter for procedure for purposes other than remedying health state, unspecified: Secondary | ICD-10-CM | POA: Diagnosis not present

## 2023-12-19 DIAGNOSIS — Z419 Encounter for procedure for purposes other than remedying health state, unspecified: Secondary | ICD-10-CM | POA: Diagnosis not present

## 2024-01-19 DIAGNOSIS — Z419 Encounter for procedure for purposes other than remedying health state, unspecified: Secondary | ICD-10-CM | POA: Diagnosis not present

## 2024-02-19 DIAGNOSIS — Z419 Encounter for procedure for purposes other than remedying health state, unspecified: Secondary | ICD-10-CM | POA: Diagnosis not present

## 2024-04-20 DIAGNOSIS — Z419 Encounter for procedure for purposes other than remedying health state, unspecified: Secondary | ICD-10-CM | POA: Diagnosis not present

## 2024-05-20 DIAGNOSIS — Z419 Encounter for procedure for purposes other than remedying health state, unspecified: Secondary | ICD-10-CM | POA: Diagnosis not present
